# Patient Record
Sex: Male | Born: 1980 | Race: White | Hispanic: No | Marital: Married | Smoking: Never smoker
Health system: Southern US, Community
[De-identification: ages and names within clinical notes are randomized; demographics above are authoritative.]

## PROBLEM LIST (undated history)

## (undated) DIAGNOSIS — T7840XA Allergy, unspecified, initial encounter: Secondary | ICD-10-CM

## (undated) DIAGNOSIS — R0789 Other chest pain: Secondary | ICD-10-CM

## (undated) DIAGNOSIS — F32A Depression, unspecified: Secondary | ICD-10-CM

## (undated) DIAGNOSIS — F329 Major depressive disorder, single episode, unspecified: Secondary | ICD-10-CM

## (undated) DIAGNOSIS — F419 Anxiety disorder, unspecified: Secondary | ICD-10-CM

## (undated) HISTORY — DX: Major depressive disorder, single episode, unspecified: F32.9

## (undated) HISTORY — DX: Allergy, unspecified, initial encounter: T78.40XA

## (undated) HISTORY — DX: Depression, unspecified: F32.A

## (undated) HISTORY — DX: Anxiety disorder, unspecified: F41.9

## (undated) HISTORY — PX: OTHER SURGICAL HISTORY: SHX169

## (undated) HISTORY — DX: Other chest pain: R07.89

---

## 2014-07-25 ENCOUNTER — Encounter (HOSPITAL_COMMUNITY): Payer: Self-pay | Admitting: *Deleted

## 2014-07-25 ENCOUNTER — Emergency Department (HOSPITAL_COMMUNITY)
Admission: EM | Admit: 2014-07-25 | Discharge: 2014-07-25 | Disposition: A | Discharge status: Home / Self Care | Payer: No Typology Code available for payment source | Attending: Emergency Medicine | Admitting: Emergency Medicine

## 2014-07-25 DIAGNOSIS — S3992XA Unspecified injury of lower back, initial encounter: Secondary | ICD-10-CM | POA: Insufficient documentation

## 2014-07-25 DIAGNOSIS — S199XXA Unspecified injury of neck, initial encounter: Secondary | ICD-10-CM | POA: Insufficient documentation

## 2014-07-25 DIAGNOSIS — Y998 Other external cause status: Secondary | ICD-10-CM | POA: Insufficient documentation

## 2014-07-25 DIAGNOSIS — Y92410 Unspecified street and highway as the place of occurrence of the external cause: Secondary | ICD-10-CM | POA: Insufficient documentation

## 2014-07-25 DIAGNOSIS — S79912A Unspecified injury of left hip, initial encounter: Secondary | ICD-10-CM | POA: Diagnosis not present

## 2014-07-25 DIAGNOSIS — Y9389 Activity, other specified: Secondary | ICD-10-CM | POA: Diagnosis not present
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
## 2015-04-18 ENCOUNTER — Encounter: Payer: Self-pay | Admitting: Podiatry

## 2015-04-18 ENCOUNTER — Ambulatory Visit (INDEPENDENT_AMBULATORY_CARE_PROVIDER_SITE_OTHER): Payer: Self-pay | Admitting: Podiatry

## 2015-04-18 ENCOUNTER — Ambulatory Visit (INDEPENDENT_AMBULATORY_CARE_PROVIDER_SITE_OTHER): Payer: Self-pay

## 2015-04-18 VITALS — BP 107/59 | HR 88 | Resp 16 | Ht 72.0 in | Wt 192.0 lb

## 2015-04-18 DIAGNOSIS — M779 Enthesopathy, unspecified: Secondary | ICD-10-CM

## 2015-04-18 DIAGNOSIS — M79673 Pain in unspecified foot: Secondary | ICD-10-CM

## 2015-04-18 NOTE — Progress Notes (Signed)
   Subjective:    Patient ID: Garrett Washington, male    DOB: [birth year], 34 y.o.   MRN: [id number]  HPI Patient presents with foot pain in their left foot-medial side - near great toe; pt stated, "has had this issue all their life".   Review of Systems  Constitutional: Positive for fever.  HENT: Positive for sinus pressure.   Allergic/Immunologic: Positive for environmental allergies.  All other systems reviewed and are negative.      Objective:   Physical Exam        Assessment & Plan:

## 2015-04-18 NOTE — Progress Notes (Signed)
Subjective:     Patient ID: Garrett Washington, male   DOB: 05/15/1980, 34 y.o.   MRN: [id number]  HPI patient states both my feet tend to be flat and I gets some discomfort on the inside of my left arch over right arch   Review of Systems  All other systems reviewed and are negative.      Objective:   Physical Exam  Constitutional: He is oriented to person, place, and time.  Cardiovascular: Intact distal pulses.   Musculoskeletal: Normal range of motion.  Neurological: He is oriented to person, place, and time.  Skin: Skin is warm.  Nursing note and vitals reviewed.  neurovascular status found to be intact muscle strength adequate range of motion within normal limits and patient found to have hypertrophy around the posterior tibial insertion into the navicular left over right with moderate depression of the arch noted     Assessment:      tendinitis secondary to foot structure    Plan:      H&P and x-rays reviewed. Scanned for custom orthotics to reduce plantar pressures and explained usage of orthotics

## 2015-05-09 ENCOUNTER — Ambulatory Visit: Payer: Self-pay | Admitting: *Deleted

## 2015-05-09 DIAGNOSIS — M779 Enthesopathy, unspecified: Secondary | ICD-10-CM

## 2015-05-09 NOTE — Patient Instructions (Signed)

## 2015-05-09 NOTE — Progress Notes (Signed)
Patient ID: Garrett Washington, male   DOB: 21-Nov-1980, 35 y.o.   MRN: [id number] Patient presents for orthotic pick up.  Verbal and written break in and wear instructions given.  Patient will follow up in 4 weeks if symptoms worsen or fail to improve.

## 2015-11-02 DIAGNOSIS — H5213 Myopia, bilateral: Secondary | ICD-10-CM | POA: Diagnosis not present

## 2015-11-06 ENCOUNTER — Ambulatory Visit: Payer: Self-pay | Admitting: Family Medicine

## 2015-11-27 ENCOUNTER — Encounter: Payer: Self-pay | Admitting: Adult Health

## 2015-11-27 ENCOUNTER — Ambulatory Visit (INDEPENDENT_AMBULATORY_CARE_PROVIDER_SITE_OTHER): Payer: 59 | Admitting: Adult Health

## 2015-11-27 VITALS — BP 128/80 | Temp 98.2°F | Ht 72.0 in | Wt 186.0 lb

## 2015-11-27 DIAGNOSIS — F418 Other specified anxiety disorders: Secondary | ICD-10-CM

## 2015-11-27 DIAGNOSIS — L0390 Cellulitis, unspecified: Secondary | ICD-10-CM | POA: Diagnosis not present

## 2015-11-27 DIAGNOSIS — Z0000 Encounter for general adult medical examination without abnormal findings: Secondary | ICD-10-CM | POA: Diagnosis not present

## 2015-11-27 DIAGNOSIS — L0291 Cutaneous abscess, unspecified: Secondary | ICD-10-CM

## 2015-11-27 DIAGNOSIS — R0789 Other chest pain: Secondary | ICD-10-CM

## 2015-11-27 DIAGNOSIS — F419 Anxiety disorder, unspecified: Secondary | ICD-10-CM

## 2015-11-27 DIAGNOSIS — F329 Major depressive disorder, single episode, unspecified: Secondary | ICD-10-CM

## 2015-11-27 LAB — BASIC METABOLIC PANEL
BUN: 13 mg/dL (ref 6–23)
CALCIUM: 9.5 mg/dL (ref 8.4–10.5)
CO2: 30 mEq/L (ref 19–32)
CREATININE: 1.02 mg/dL (ref 0.40–1.50)
Chloride: 103 mEq/L (ref 96–112)
GFR: 88.16 mL/min (ref >60.00)
GLUCOSE: 84 mg/dL (ref 70–99)
Potassium: 3.8 mEq/L (ref 3.5–5.1)
Sodium: 142 mEq/L (ref 135–145)

## 2015-11-27 LAB — CBC WITH DIFFERENTIAL/PLATELET
BASOS ABS: 0 10*3/uL (ref 0.0–0.1)
Basophils Relative: 0.5 % (ref 0.0–3.0)
EOS ABS: 0.1 10*3/uL (ref 0.0–0.7)
Eosinophils Relative: 1.4 % (ref 0.0–5.0)
HCT: 42.5 % (ref 39.0–52.0)
Hemoglobin: 14.7 g/dL (ref 13.0–17.0)
LYMPHS ABS: 1.8 10*3/uL (ref 0.7–4.0)
LYMPHS PCT: 30.6 % (ref 12.0–46.0)
MCHC: 34.7 g/dL (ref 30.0–36.0)
MCV: 83.4 fl (ref 78.0–100.0)
Monocytes Absolute: 0.4 10*3/uL (ref 0.1–1.0)
Monocytes Relative: 6.5 % (ref 3.0–12.0)
NEUTROS ABS: 3.5 10*3/uL (ref 1.4–7.7)
Neutrophils Relative %: 61 % (ref 43.0–77.0)
PLATELETS: 136 10*3/uL — AB (ref 150.0–400.0)
RBC: 5.1 Mil/uL (ref 4.22–5.81)
RDW: 13.5 % (ref 11.5–15.5)
WBC: 5.8 10*3/uL (ref 4.0–10.5)

## 2015-11-27 LAB — LIPID PANEL
CHOLESTEROL: 180 mg/dL (ref 0–200)
HDL: 43.6 mg/dL (ref >39.00)
LDL Cholesterol: 123 mg/dL — ABNORMAL HIGH (ref 0–99)
NonHDL: 136.76
TRIGLYCERIDES: 68 mg/dL (ref 0.0–149.0)
Total CHOL/HDL Ratio: 4
VLDL: 13.6 mg/dL (ref 0.0–40.0)

## 2015-11-27 LAB — HEPATIC FUNCTION PANEL
ALT: 19 U/L (ref 0–53)
AST: 17 U/L (ref 0–37)
Albumin: 4.5 g/dL (ref 3.5–5.2)
Alkaline Phosphatase: 67 U/L (ref 39–117)
Bilirubin, Direct: 0.1 mg/dL (ref 0.0–0.3)
Total Bilirubin: 0.7 mg/dL (ref 0.2–1.2)
Total Protein: 7 g/dL (ref 6.0–8.3)

## 2015-11-27 LAB — POC URINALSYSI DIPSTICK (AUTOMATED)
Bilirubin, UA: NEGATIVE
Blood, UA: NEGATIVE
Glucose, UA: NEGATIVE
KETONES UA: NEGATIVE
Leukocytes, UA: NEGATIVE
Nitrite, UA: NEGATIVE
PROTEIN UA: NEGATIVE
SPEC GRAV UA: 1.02
Urobilinogen, UA: 0.2
pH, UA: 6

## 2015-11-27 LAB — TSH: TSH: 2.2 u[IU]/mL (ref 0.35–4.50)

## 2015-11-27 MED ORDER — DOXYCYCLINE HYCLATE 100 MG PO CAPS: 100.0000 mg | ORAL_CAPSULE | Freq: Every day | ORAL | 1 refills | Status: DC

## 2015-11-27 NOTE — Progress Notes (Signed)
Patient presents to clinic today to establish care. He is a pleasant 35 year male who  has a past medical history of Anxiety; Chest wall pain; and Depression.  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   Acute Concerns: Complete Physical   Chronic Issues: Anxiety/Depression  - Feels as though this is well controlled. He is not on any medication. Occassional he feels anxious but chalks this up to anxiety from his job   Chest wall pain - This is a chronic issue. Feels as though this is from anxiety   Abscess - He has multiple small abscesss' around his belt line. This is a chronic issue. Denies using anything OTC to help.   Health Maintenance: Dental -- Does not see [facility] -- Routine visits.  Immunizations --UTD  Diet: eats healthy Exercise: likes to hike  Past Medical History:  Diagnosis Date  . Anxiety   . Chest wall pain   . Depression     Past Surgical History:  Procedure Laterality Date  . cyst removal       No current outpatient prescriptions on file prior to visit.   No current facility-administered medications on file prior to visit.     No Known Allergies  Family History  Problem Relation Age of Onset  . Hyperlipidemia Mother   . Hypertension Mother   . Arthritis Mother   . Thyroid cancer Mother   . Hodgkin's lymphoma Mother     Social History   Social History  . Marital status: Married    Spouse name: N/A  . Number of children: N/A  . Years of education: N/A   Occupational History  . Respiratory Therapist    Social History Main Topics  . Smoking status: Never Smoker  . Smokeless tobacco: Never Used  . Alcohol use 1.8 oz/week    3 Cans of beer per week     Comment: "Maybe 3-4 beers a week,"  . Drug use: No  . Sexual activity: Not on file   Other Topics Concern  . 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           Review of Systems  Constitutional: Negative.   HENT: Negative.   Eyes: Negative.   Respiratory: Negative.   Cardiovascular: Positive for chest pain (chronic ).  Gastrointestinal: Negative.   Genitourinary: Negative.   Musculoskeletal: Negative.   Skin: Negative.        Abscess around belt line  Neurological: Negative.   Endo/Heme/Allergies: Negative.   Psychiatric/Behavioral: Negative.   All other systems reviewed and are negative.      BP 128/80   Temp 98.2 F (36.8 C) (Oral)   Ht 6' (1.829 m)   Wt 186 lb (84.4 kg)   BMI 25.23 kg/m   Physical Exam  Constitutional: He is oriented to person, place, and time and well-developed, well-nourished, and in no distress. No distress.  HENT:  Head: Normocephalic and atraumatic.  Right Ear: External ear normal.  Left Ear: External ear normal.  Nose: Nose normal.  Mouth/Throat: Oropharynx is clear and moist. No oropharyngeal exudate.  Eyes: Conjunctivae and EOM are normal. Pupils are equal, round, and reactive to light. Right eye exhibits no discharge. Left eye exhibits no discharge. No scleral  icterus.  Neck: Normal range of motion. Neck supple. No JVD present. No tracheal deviation present. No thyromegaly present.  Cardiovascular: Normal rate, regular rhythm, normal heart sounds and intact distal pulses.  Exam reveals no gallop and no friction rub.   No murmur heard. Pulmonary/Chest: Effort normal and breath sounds normal. No stridor. No respiratory distress. He has no wheezes. He has no rales. He exhibits no tenderness.  Abdominal: Soft. Bowel sounds are normal. He exhibits no distension and no mass. There is no tenderness. There is no rebound and no guarding.  Musculoskeletal: He exhibits no edema, tenderness or deformity.  Lymphadenopathy:    He has no cervical adenopathy.  Neurological: He is alert and oriented to person, place, and time. He has normal reflexes. He displays normal reflexes. No cranial  nerve deficit. He exhibits normal muscle tone. Gait normal. Coordination normal. GCS score is 15.  Skin: Skin is warm and dry. No rash noted. He is not diaphoretic. No erythema. No pallor.  Multiple small abscess around left side of waist. No drainage noted. Tenderness with palpation   Psychiatric: Mood, memory, affect and judgment normal.  Nursing note and vitals reviewed.   Assessment/Plan: 1. Routine general medical examination at a health care facility - POCT Urinalysis Dipstick (Automated) - Basic metabolic panel - CBC with Differential/Platelet - Hepatic function panel - Lipid panel - TSH - Follow up in one year or sooner if needed   2. Anxiety and depression - Controlled  3. Chest wall pain - EKG 12-Lead- Sinus  Rhythm  -  Negative precordial T-waves ( possibly artifact) ., Rate 61  4. Cellulitis and abscess - doxycycline (VIBRAMYCIN) 100 MG capsule; Take 1 capsule (100 mg total) by mouth daily.  Dispense: 90 capsule; Refill: 1  Dorothyann Peng, NP

## 2015-11-27 NOTE — Patient Instructions (Addendum)
It was great meeting you today!  I will follow up with you regarding your blood work   I have sent in a prescription for Doxycycline, take one pill daily until the abscess resolves. You can also try applying powder to the area.   Follow up with me in one year or sooner if needed  Health Maintenance, Male A healthy lifestyle and preventative care can promote health and wellness.  Maintain regular health, dental, and eye exams.  Eat a healthy diet. Foods like vegetables, fruits, whole grains, low-fat dairy products, and lean protein foods contain the nutrients you need and are low in calories. Decrease your intake of foods high in solid fats, added sugars, and salt. Get information about a proper diet from your health care provider, if necessary.  Regular physical exercise is one of the most important things you can do for your health. Most adults should get at least 150 minutes of moderate-intensity exercise (any activity that increases your heart rate and causes you to sweat) each week. In addition, most adults need muscle-strengthening exercises on 2 or more days a week.   Maintain a healthy weight. The body mass index (BMI) is a screening tool to identify possible weight problems. It provides an estimate of body fat based on height and weight. Your health care provider can find your BMI and can help you achieve or maintain a healthy weight. For males 20 years and older:  A BMI below 18.5 is considered underweight.  A BMI of 18.5 to 24.9 is normal.  A BMI of 25 to 29.9 is considered overweight.  A BMI of 30 and above is considered obese.  Maintain normal blood lipids and cholesterol by exercising and minimizing your intake of saturated fat. Eat a balanced diet with plenty of fruits and vegetables. Blood tests for lipids and cholesterol should begin at age 60 and be repeated every 5 years. If your lipid or cholesterol levels are high, you are over age 93, or you are at high risk for heart  disease, you may need your cholesterol levels checked more frequently.Ongoing high lipid and cholesterol levels should be treated with medicines if diet and exercise are not working.  If you smoke, find out from your health care provider how to quit. If you do not use tobacco, do not start.  Lung cancer screening is recommended for adults aged 32-80 years who are at high risk for developing lung cancer because of a history of smoking. A yearly low-dose CT scan of the lungs is recommended for people who have at least a 30-pack-year history of smoking and are current smokers or have quit within the past 15 years. A pack year of smoking is smoking an average of 1 pack of cigarettes a day for 1 year (for example, a 30-pack-year history of smoking could mean smoking 1 pack a day for 30 years or 2 packs a day for 15 years). Yearly screening should continue until the smoker has stopped smoking for at least 15 years. Yearly screening should be stopped for people who develop a health problem that would prevent them from having lung cancer treatment.  If you choose to drink alcohol, do not have more than 2 drinks per day. One drink is considered to be 12 oz (360 mL) of beer, 5 oz (150 mL) of wine, or 1.5 oz (45 mL) of liquor.  Avoid the use of street drugs. Do not share needles with anyone. Ask for help if you need support or instructions  about stopping the use of drugs.  High blood pressure causes heart disease and increases the risk of stroke. High blood pressure is more likely to develop in:  People who have blood pressure in the end of the normal range (100-139/85-89 mm Hg).  People who are overweight or obese.  People who are African American.  If you are 79-1 years of age, have your blood pressure checked every 3-5 years. If you are 41 years of age or older, have your blood pressure checked every year. You should have your blood pressure measured twice--once when you are at a hospital or clinic, and  once when you are not at a hospital or clinic. Record the average of the two measurements. To check your blood pressure when you are not at a hospital or clinic, you can use:  An automated blood pressure machine at a pharmacy.  A home blood pressure monitor.  If you are 98-57 years old, ask your health care provider if you should take aspirin to prevent heart disease.  Diabetes screening involves taking a blood sample to check your fasting blood sugar level. This should be done once every 3 years after age 18 if you are at a normal weight and without risk factors for diabetes. Testing should be considered at a younger age or be carried out more frequently if you are overweight and have at least 1 risk factor for diabetes.  Colorectal cancer can be detected and often prevented. Most routine colorectal cancer screening begins at the age of 57 and continues through age 57. However, your health care provider may recommend screening at an earlier age if you have risk factors for colon cancer. On a yearly basis, your health care provider may provide home test kits to check for hidden blood in the stool. A small camera at the end of a tube may be used to directly examine the colon (sigmoidoscopy or colonoscopy) to detect the earliest forms of colorectal cancer. Talk to your health care provider about this at age 32 when routine screening begins. A direct exam of the colon should be repeated every 5-10 years through age 64, unless early forms of precancerous polyps or small growths are found.  People who are at an increased risk for hepatitis B should be screened for this virus. You are considered at high risk for hepatitis B if:  You were born in a country where hepatitis B occurs often. Talk with your health care provider about which countries are considered high risk.  Your parents were born in a high-risk country and you have not received a shot to protect against hepatitis B (hepatitis B  vaccine).  You have HIV or AIDS.  You use needles to inject street drugs.  You live with, or have sex with, someone who has hepatitis B.  You are a man who has sex with other men (MSM).  You get hemodialysis treatment.  You take certain medicines for conditions like cancer, organ transplantation, and autoimmune conditions.  Hepatitis C blood testing is recommended for all people born from [birth year] through 1965 and any individual with known risk factors for hepatitis C.  Healthy men should no longer receive prostate-specific antigen (PSA) blood tests as part of routine cancer screening. Talk to your health care provider about prostate cancer screening.  Testicular cancer screening is not recommended for adolescents or adult males who have no symptoms. Screening includes self-exam, a health care provider exam, and other screening tests. Consult with your health care provider  about any symptoms you have or any concerns you have about testicular cancer.  Practice safe sex. Use condoms and avoid high-risk sexual practices to reduce the spread of sexually transmitted infections (STIs).  You should be screened for STIs, including gonorrhea and chlamydia if:  You are sexually active and are younger than 24 years.  You are older than 24 years, and your health care provider tells you that you are at risk for this type of infection.  Your sexual activity has changed since you were last screened, and you are at an increased risk for chlamydia or gonorrhea. Ask your health care provider if you are at risk.  If you are at risk of being infected with HIV, it is recommended that you take a prescription medicine daily to prevent HIV infection. This is called pre-exposure prophylaxis (PrEP). You are considered at risk if:  You are a man who has sex with other men (MSM).  You are a heterosexual man who is sexually active with multiple partners.  You take drugs by injection.  You are sexually active  with a partner who has HIV.  Talk with your health care provider about whether you are at high risk of being infected with HIV. If you choose to begin PrEP, you should first be tested for HIV. You should then be tested every 3 months for as long as you are taking PrEP.  Use sunscreen. Apply sunscreen liberally and repeatedly throughout the day. You should seek shade when your shadow is shorter than you. Protect yourself by wearing long sleeves, pants, a wide-brimmed hat, and sunglasses year round whenever you are outdoors.  Tell your health care provider of new moles or changes in moles, especially if there is a change in shape or color. Also, tell your health care provider if a mole is larger than the size of a pencil eraser.  A one-time screening for abdominal aortic aneurysm (AAA) and surgical repair of large AAAs by ultrasound is recommended for men aged 23-75 years who are current or former smokers.  Stay current with your vaccines (immunizations).   This information is not intended to replace advice given to you by your health care provider. Make sure you discuss any questions you have with your health care provider.   Document Released: 10/02/2007 Document Revised: 04/26/2014 Document Reviewed: 08/31/2010 Elsevier Interactive Patient Education [employer].

## 2015-12-01 ENCOUNTER — Telehealth: Payer: Self-pay | Admitting: Adult Health

## 2015-12-01 NOTE — Telephone Encounter (Signed)
Garrett Washington pt returning your call

## 2015-12-02 NOTE — Telephone Encounter (Signed)
Patient informed of lab results. 

## 2016-01-19 DIAGNOSIS — L814 Other melanin hyperpigmentation: Secondary | ICD-10-CM | POA: Diagnosis not present

## 2016-01-19 DIAGNOSIS — Z86018 Personal history of other benign neoplasm: Secondary | ICD-10-CM | POA: Diagnosis not present

## 2016-01-19 DIAGNOSIS — D225 Melanocytic nevi of trunk: Secondary | ICD-10-CM | POA: Diagnosis not present

## 2016-01-19 DIAGNOSIS — L821 Other seborrheic keratosis: Secondary | ICD-10-CM | POA: Diagnosis not present

## 2016-01-19 DIAGNOSIS — D1801 Hemangioma of skin and subcutaneous tissue: Secondary | ICD-10-CM | POA: Diagnosis not present

## 2016-09-14 ENCOUNTER — Ambulatory Visit: Payer: 59 | Admitting: Adult Health

## 2016-09-20 ENCOUNTER — Encounter: Payer: Self-pay | Admitting: Family Medicine

## 2016-09-20 ENCOUNTER — Ambulatory Visit (INDEPENDENT_AMBULATORY_CARE_PROVIDER_SITE_OTHER): Payer: Managed Care, Other (non HMO) | Admitting: Family Medicine

## 2016-09-20 VITALS — BP 118/90 | HR 76 | Temp 98.7°F | Wt 185.6 lb

## 2016-09-20 DIAGNOSIS — J324 Chronic pansinusitis: Secondary | ICD-10-CM

## 2016-09-20 DIAGNOSIS — R05 Cough: Secondary | ICD-10-CM

## 2016-09-20 DIAGNOSIS — R059 Cough, unspecified: Secondary | ICD-10-CM

## 2016-09-20 MED ORDER — BENZONATATE 100 MG PO CAPS: 100.0000 mg | ORAL_CAPSULE | Freq: Three times a day (TID) | ORAL | 0 refills | Status: DC

## 2016-09-20 MED ORDER — AMOXICILLIN-POT CLAVULANATE 875-125 MG PO TABS: 1.0000 | ORAL_TABLET | Freq: Two times a day (BID) | ORAL | 0 refills | Status: DC

## 2016-09-20 NOTE — Patient Instructions (Addendum)
Please take medications as directed and follow up if symptoms do not improve with treatment in 3 to 4 days, worsens, or you develop a fever >100.  Also, please schedule a physical with your provider that is due in August.   Sinusitis, Adult Sinusitis is soreness and inflammation of your sinuses. Sinuses are hollow spaces in the bones around your face. They are located:  Around your eyes.  In the middle of your forehead.  Behind your nose.  In your cheekbones.  Your sinuses and nasal passages are lined with a stringy fluid (mucus). Mucus normally drains out of your sinuses. When your nasal tissues get inflamed or swollen, the mucus can get trapped or blocked so air cannot flow through your sinuses. This lets bacteria, viruses, and funguses grow, and that leads to infection. Follow these instructions at home: Medicines  Take, use, or apply over-the-counter and prescription medicines only as told by your doctor. These may include nasal sprays.  If you were prescribed an antibiotic medicine, take it as told by your doctor. Do not stop taking the antibiotic even if you start to feel better. Hydrate and Humidify  Drink enough water to keep your pee (urine) clear or pale yellow.  Use a cool mist humidifier to keep the humidity level in your home above 50%.  Breathe in steam for 10-15 minutes, 3-4 times a [place] or as told by your doctor. You can do this in the bathroom while a hot shower is running.  Try not to spend time in cool or dry air. Rest  Rest as much as possible.  Sleep with your head raised (elevated).  Make sure to get enough sleep each night. General instructions  Put a warm, moist washcloth on your face 3-4 times a Washington or as told by your doctor. This will help with discomfort.  Wash your hands often with soap and water. If there is no soap and water, use hand sanitizer.  Do not smoke. Avoid being around people who are smoking (secondhand smoke).  Keep all follow-up  visits as told by your doctor. This is important. Contact a doctor if:  You have a fever.  Your symptoms get worse.  Your symptoms do not get better within 10 days. Get help right away if:  You have a very bad headache.  You cannot stop throwing up (vomiting).  You have pain or swelling around your face or eyes.  You have trouble seeing.  You feel confused.  Your neck is stiff.  You have trouble breathing. This information is not intended to replace advice given to you by your health care provider. Make sure you discuss any questions you have with your health care provider. Document Released: 09/22/2007 Document Revised: 11/30/2015 Document Reviewed: 01/29/2015 Elsevier Interactive Patient Education  2018 [employer] NOW OFFER   Garrett Washington Garrett TRACK!!!  Garrett Washington Appointments for ACUTE CARE  Such as: Sprains, Injuries, cuts, abrasions, rashes, muscle pain, joint pain, back pain Colds, flu, sore throats, headache, allergies, cough, fever  Ear pain, sinus and eye infections Abdominal pain, nausea, vomiting, diarrhea, upset stomach Animal/insect bites  3 Easy Ways to Schedule: Walk-In Scheduling Call in scheduling Mychart Sign-up: [email or web address]

## 2016-09-20 NOTE — Progress Notes (Signed)
Patient ID: Garrett Washington, male   DOB: 1981-03-01, 36 y.o.   MRN: [id number]  PCP: Dorothyann Peng, NP  Subjective:  Garrett Washington is a 36 y.o. year old very pleasant male patient who presents with  nasal congestion, sinus pressure/pain, post nasal drip, and cough that is nonproductive.   -started: one month ago, symptoms not improving -previous treatments: Mucinex and Zyrtec has provided moderate benefit. -sick contacts/travel/risks: denies flu exposure: Recent exposure with 2 sick work contacts and he works as a Statistician -Hx of: allergies Symptoms started with allergic rhinitis that triggered a URI that improved however cough has lingered with nasal congestion and sinus pressure. ROS-denies fever, SOB, NVD, tooth pain No recent antibiotic use  Pertinent Past Medical History- Seasonal allergies  Medications- reviewed  No current outpatient prescriptions on file.   No current facility-administered medications for this visit.     Objective: BP 118/90 (BP Location: Left Arm, Patient Position: Sitting, Cuff Size: Normal)   Pulse 76   Temp 98.7 F (37.1 C) (Oral)   Wt 185 lb 9.6 oz (84.2 kg)   SpO2 98%   BMI 25.17 kg/m  Gen: NAD, resting comfortably HEENT: Turbinates erythematous, TM normal, pharynx mildly erythematous with no tonsilar exudate or edema, positive frontal and maxillary sinus tenderness CV: RRR no murmurs rubs or gallops Lungs: CTAB no crackles, wheeze, rhonchi Abdomen: soft/nontender/nondistended/normal bowel sounds. No rebound or guarding.  Ext: no edema Skin: warm, dry, no rash Neuro: grossly normal, moves all extremities  Assessment/Plan: 1. Pansinusitis, unspecified chronicity Symptom duration is likely due to sinusitis. Treat with Augmentin and advised patient on supportive measures:  Get rest, drink plenty of fluids, and use tylenol or ibuprofen as needed for pain. Follow up if fever >101, if symptoms worsen or if symptoms are not improved in 3 to  4 days. Patient verbalizes understanding.    2. Cough Mucinex or benzonatate for cough as needed.   Finally, we reviewed reasons to return to care including if symptoms worsen or persist or new concerns arise- once again particularly shortness of breath or fever.  Advised patient that he will be due for a physical in August.  Laurita Quint, [place]

## 2016-09-21 ENCOUNTER — Ambulatory Visit: Payer: 59 | Admitting: Adult Health

## 2016-11-03 ENCOUNTER — Encounter: Payer: Managed Care, Other (non HMO) | Admitting: Adult Health

## 2016-11-03 NOTE — Progress Notes (Deleted)
   Subjective:    Patient ID: Garrett Washington, male    DOB: 1980/04/20, 36 y.o.   MRN: [id number]  HPI  Patient presents for yearly physical  examination. He is a pleasant 36 year old male who  has a past medical history of Allergy; Anxiety; Chest wall pain; and Depression.  All immunizations and health maintenance protocols were reviewed with the patient and needed orders were placed.  Appropriate screening laboratory values were ordered for the patient including screening of hyperlipidemia, renal function and hepatic function. If indicated by BPH, a PSA was ordered.  Medication reconciliation,  past medical history, social history, problem list and allergies were reviewed in detail with the patient  Goals were established with regard to weight loss, exercise, and  diet in compliance with medications      Review of Systems     Objective:   Physical Exam        Assessment & Plan:

## 2016-12-21 ENCOUNTER — Encounter: Payer: Self-pay | Admitting: Family Medicine

## 2016-12-21 ENCOUNTER — Ambulatory Visit (INDEPENDENT_AMBULATORY_CARE_PROVIDER_SITE_OTHER): Payer: Managed Care, Other (non HMO) | Admitting: Family Medicine

## 2016-12-21 VITALS — BP 110/80 | HR 73 | Temp 98.3°F | Wt 194.4 lb

## 2016-12-21 DIAGNOSIS — K219 Gastro-esophageal reflux disease without esophagitis: Secondary | ICD-10-CM

## 2016-12-21 DIAGNOSIS — R0981 Nasal congestion: Secondary | ICD-10-CM

## 2016-12-21 NOTE — Progress Notes (Signed)
Subjective:     Patient ID: Garrett Washington, male   DOB: 03-May-1980, 36 y.o.   MRN: [id number]  HPI Patient seen with several week history of some pansinusitis type pressure but symptoms somewhat intermittent. Denies any cough.. Also complains of fairly frequent GERD symptoms. These occur frequently at night. Sometimes wakes up with sour taste in mouth.  Patient had diagnosis of "pansinusitis "back in early June and did feel better somewhat after Augmentin. He states these symptoms are somewhat different. No headaches. No fever. No chills. He drinks about 2 cups of coffee per day. Has used Flonase but only sporadically.  Denies any exertional chest pains. No exertional dyspnea.  Past Medical History:  Diagnosis Date  . Allergy   . Anxiety   . Chest wall pain   . Depression    Past Surgical History:  Procedure Laterality Date  . cyst removal       reports that he has never smoked. He has never used smokeless tobacco. He reports that he drinks about 1.8 oz of alcohol per week . He reports that he does not use drugs. family history includes Arthritis in his mother; Hodgkin's lymphoma in his mother; Hyperlipidemia in his mother; Hypertension in his mother; Thyroid cancer in his mother. No Known Allergies\   Review of Systems  Constitutional: Negative for chills and fever.  HENT: Positive for congestion and sinus pressure.   Respiratory: Negative for cough, shortness of breath and wheezing.   Cardiovascular: Negative for chest pain.       Objective:   Physical Exam  Constitutional: He appears well-developed and well-nourished.  HENT:  Right Ear: External ear normal.  Left Ear: External ear normal.  Mouth/Throat: Oropharynx is clear and moist.  Neck: Neck supple.  Cardiovascular: Normal rate and regular rhythm.   Pulmonary/Chest: Effort normal and breath sounds normal. No respiratory distress. He has no wheezes. He has no rales.  Lymphadenopathy:    He has no cervical adenopathy.        Assessment:     Patient presents with several week history of sinus congestion along with probable GERD symptoms especially at night. We explained that his GERD could be triggering some chronic sinus congestion    Plan:     -Dietary factors discussed. Try to gradually reduce caffeine intake -avoid eating within 2-3 hours of bedtime. -Get back on Flonase daily -Recommend trial of over-the-counter Nexium or Prilosec -Follow-up for any fever or other change of symptoms -follow up with primary if congestion not improving with Flonase and better GERD control.  Eulas Post MD [facility]

## 2016-12-21 NOTE — Patient Instructions (Signed)
Food Choices for Gastroesophageal Reflux Disease, Adult When you have gastroesophageal reflux disease (GERD), the foods you eat and your eating habits are very important. Choosing the right foods can help ease the discomfort of GERD. Consider working with a diet and nutrition specialist (dietitian) to help you make healthy food choices. What general guidelines should I follow? Eating plan  Choose healthy foods low in fat, such as fruits, vegetables, whole grains, low-fat dairy products, and lean meat, fish, and poultry.  Eat frequent, small meals instead of three large meals each day. Eat your meals slowly, in a relaxed setting. Avoid bending over or lying down until 2-3 hours after eating.  Limit high-fat foods such as fatty meats or fried foods.  Limit your intake of oils, butter, and shortening to less than 8 teaspoons each day.  Avoid the following: ? Foods that cause symptoms. These may be different for different people. Keep a food diary to keep track of foods that cause symptoms. ? Alcohol. ? Drinking large amounts of liquid with meals. ? Eating meals during the 2-3 hours before bed.  Cook foods using methods other than frying. This may include baking, grilling, or broiling. Lifestyle   Maintain a healthy weight. Ask your health care provider what weight is healthy for you. If you need to lose weight, work with your health care provider to do so safely.  Exercise for at least 30 minutes on 5 or more days each week, or as told by your health care provider.  Avoid wearing clothes that fit tightly around your waist and chest.  Do not use any products that contain nicotine or tobacco, such as cigarettes and e-cigarettes. If you need help quitting, ask your health care provider.  Sleep with the head of your bed raised. Use a wedge under the mattress or blocks under the bed frame to raise the head of the bed. What foods are not recommended? The items listed may not be a complete  list. Talk with your dietitian about what dietary choices are best for you. Grains Pastries or quick breads with added fat. French toast. Vegetables Deep fried vegetables. French fries. Any vegetables prepared with added fat. Any vegetables that cause symptoms. For some people this may include tomatoes and tomato products, chili peppers, onions and garlic, and horseradish. Fruits Any fruits prepared with added fat. Any fruits that cause symptoms. For some people this may include citrus fruits, such as oranges, grapefruit, pineapple, and lemons. Meats and other protein foods High-fat meats, such as fatty beef or pork, hot dogs, ribs, ham, sausage, salami and bacon. Fried meat or protein, including fried fish and fried chicken. Nuts and nut butters. Dairy Whole milk and chocolate milk. Sour cream. Cream. Ice cream. Cream cheese. Milk shakes. Beverages Coffee and tea, with or without caffeine. Carbonated beverages. Sodas. Energy drinks. Fruit juice made with acidic fruits (such as orange or grapefruit). Tomato juice. Alcoholic drinks. Fats and oils Butter. Margarine. Shortening. Ghee. Sweets and desserts Chocolate and cocoa. Donuts. Seasoning and other foods Pepper. Peppermint and spearmint. Any condiments, herbs, or seasonings that cause symptoms. For some people, this may include curry, hot sauce, or vinegar-based salad dressings. Summary  When you have gastroesophageal reflux disease (GERD), food and lifestyle choices are very important to help ease the discomfort of GERD.  Eat frequent, small meals instead of three large meals each day. Eat your meals slowly, in a relaxed setting. Avoid bending over or lying down until 2-3 hours after eating.  Limit high-fat   foods such as fatty meat or fried foods. This information is not intended to replace advice given to you by your health care provider. Make sure you discuss any questions you have with your health care provider. Document Released:  04/05/2005 Document Revised: 04/06/2016 Document Reviewed: 04/06/2016 Elsevier Interactive Patient Education  2017 [place] back on daily Flonase Consider trial of OTC Nexium or Prilosec.   Try to gradually reduce caffeine.

## 2017-01-07 ENCOUNTER — Encounter: Payer: Self-pay | Admitting: Adult Health

## 2017-01-20 ENCOUNTER — Encounter: Payer: Self-pay | Admitting: Adult Health

## 2017-01-20 ENCOUNTER — Ambulatory Visit (INDEPENDENT_AMBULATORY_CARE_PROVIDER_SITE_OTHER): Payer: Managed Care, Other (non HMO) | Admitting: Adult Health

## 2017-01-20 VITALS — BP 110/60 | Temp 97.9°F | Ht 73.0 in | Wt 195.0 lb

## 2017-01-20 DIAGNOSIS — F419 Anxiety disorder, unspecified: Secondary | ICD-10-CM

## 2017-01-20 DIAGNOSIS — F329 Major depressive disorder, single episode, unspecified: Secondary | ICD-10-CM | POA: Diagnosis not present

## 2017-01-20 DIAGNOSIS — Z0000 Encounter for general adult medical examination without abnormal findings: Secondary | ICD-10-CM

## 2017-01-20 DIAGNOSIS — F32A Depression, unspecified: Secondary | ICD-10-CM

## 2017-01-20 LAB — CBC WITH DIFFERENTIAL/PLATELET
Basophils Absolute: 0 10*3/uL (ref 0.0–0.1)
Basophils Relative: 0.7 % (ref 0.0–3.0)
Eosinophils Absolute: 0.1 10*3/uL (ref 0.0–0.7)
Eosinophils Relative: 2.3 % (ref 0.0–5.0)
HCT: 43.7 % (ref 39.0–52.0)
Hemoglobin: 14.8 g/dL (ref 13.0–17.0)
LYMPHS ABS: 1.4 10*3/uL (ref 0.7–4.0)
Lymphocytes Relative: 28.1 % (ref 12.0–46.0)
MCHC: 33.8 g/dL (ref 30.0–36.0)
MCV: 84.5 fl (ref 78.0–100.0)
MONO ABS: 0.4 10*3/uL (ref 0.1–1.0)
MONOS PCT: 8 % (ref 3.0–12.0)
NEUTROS PCT: 60.9 % (ref 43.0–77.0)
Neutro Abs: 3.1 10*3/uL (ref 1.4–7.7)
Platelets: 125 10*3/uL — ABNORMAL LOW (ref 150.0–400.0)
RBC: 5.17 Mil/uL (ref 4.22–5.81)
RDW: 14.5 % (ref 11.5–15.5)
WBC: 5.2 10*3/uL (ref 4.0–10.5)

## 2017-01-20 LAB — COMPREHENSIVE METABOLIC PANEL
ALBUMIN: 4.4 g/dL (ref 3.5–5.2)
ALT: 21 U/L (ref 0–53)
AST: 18 U/L (ref 0–37)
Alkaline Phosphatase: 73 U/L (ref 39–117)
BUN: 11 mg/dL (ref 6–23)
CO2: 29 mEq/L (ref 19–32)
Calcium: 9.6 mg/dL (ref 8.4–10.5)
Chloride: 102 mEq/L (ref 96–112)
Creatinine, Ser: 0.94 mg/dL (ref 0.40–1.50)
GFR: 96.25 mL/min (ref >60.00)
Glucose, Bld: 82 mg/dL (ref 70–99)
Potassium: 3.7 mEq/L (ref 3.5–5.1)
Sodium: 140 mEq/L (ref 135–145)
Total Bilirubin: 0.7 mg/dL (ref 0.2–1.2)
Total Protein: 7 g/dL (ref 6.0–8.3)

## 2017-01-20 LAB — LIPID PANEL
Cholesterol: 181 mg/dL (ref 0–200)
HDL: 44.6 mg/dL (ref >39.00)
LDL Cholesterol: 117 mg/dL — ABNORMAL HIGH (ref 0–99)
NonHDL: 136.14
Total CHOL/HDL Ratio: 4
Triglycerides: 96 mg/dL (ref 0.0–149.0)
VLDL: 19.2 mg/dL (ref 0.0–40.0)

## 2017-01-20 MED ORDER — MAGIC MOUTHWASH W/LIDOCAINE: 5.0000 mL | Freq: Three times a day (TID) | ORAL | 1 refills | Status: DC | PRN

## 2017-01-20 NOTE — Progress Notes (Signed)
Subjective:    Patient ID: Garrett Washington, male    DOB: 13-Aug-1980, 36 y.o.   MRN: [id number]  HPI  Patient presents for yearly preventative medicine examination. He is a pleasant 36 year old male who  has a past medical history of Allergy; Anxiety; Chest wall pain; and Depression.   Anxiety/Depression  - Feels as though this is well controlled without medication - stable   Chest wall pain - This is a chronic issue. Feels as though this is from anxiety - stable   GERD - was stared on Prilosec by Dr. Elease Hashimoto, one month ago. Reports significant improvement since starting this medication   All immunizations and health maintenance protocols were reviewed with the patient and needed orders were placed.    Appropriate screening laboratory values were ordered for the patient including screening of hyperlipidemia, renal function and hepatic function. Medication reconciliation,  past medical history, social history, problem list and allergies were reviewed in detail with the patient  Goals were established with regard to weight loss, exercise, and  diet in compliance with medications. He eats a heart healthy diet and is an active individual  He reports that since the last time we saw each other, he has a new baby and he has decided to go to [employer] school, so he is working on the pre reqs   Review of Systems  Constitutional: Negative.   HENT: Negative.   Eyes: Negative.   Respiratory: Negative.   Cardiovascular: Negative.   Gastrointestinal: Negative.   Endocrine: Negative.   Genitourinary: Negative.   Musculoskeletal: Negative.   Skin: Negative.   Allergic/Immunologic: Negative.   Neurological: Negative.   Hematological: Negative.   Psychiatric/Behavioral: Negative.   All other systems reviewed and are negative.  Past Medical History:  Diagnosis Date  . Allergy   . Anxiety   . Chest wall pain   . Depression     Social History   Social History  . Marital status: Married    Spouse name: N/A  . Number of children: N/A  . Years of education: N/A   Occupational History  . Respiratory Therapist    Social History Main Topics  . Smoking status: Never Smoker  . Smokeless tobacco: Never Used  . Alcohol use 1.8 oz/week    3 Cans of beer per week     Comment: "Maybe 3-4 beers a week,"  . Drug use: No  . Sexual activity: Not on file   Other Topics Concern  . 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           Past Surgical History:  Procedure Laterality Date  . cyst removal       Family History  Problem Relation Age of Onset  . Hyperlipidemia Mother   . Hypertension Mother   . Arthritis Mother   . Thyroid cancer Mother   . Hodgkin's lymphoma Mother     No Known Allergies  No current outpatient prescriptions on file prior to visit.   No current facility-administered medications on file prior to visit.     Temp 97.9 F (36.6 C) (Oral)   Ht 6\' 1"  (1.854 m)   Wt 195 lb (88.5 kg)   BMI 25.73 kg/m       Objective:   Physical Exam  Constitutional: He is  oriented to person, place, and time. He appears well-developed and well-nourished. No distress.  HENT:  Head: Normocephalic and atraumatic.  Right Ear: External ear normal.  Left Ear: External ear normal.  Nose: Nose normal.  Mouth/Throat: Oropharynx is clear and moist. No oropharyngeal exudate.  Eyes: Pupils are equal, round, and reactive to light. Conjunctivae and EOM are normal. Right eye exhibits no discharge. Left eye exhibits no discharge. No scleral icterus.  Neck: Normal range of motion. Neck supple. No JVD present. No tracheal deviation present. No thyromegaly present.  Cardiovascular: Normal rate, regular rhythm, normal heart sounds and intact distal pulses.  Exam reveals no gallop and no friction rub.   No murmur heard. Pulmonary/Chest: Effort normal and breath sounds  normal. No stridor. No respiratory distress. He has no wheezes. He has no rales. He exhibits no tenderness.  Abdominal: Soft. Bowel sounds are normal. He exhibits no distension and no mass. There is no tenderness. There is no rebound and no guarding.  Musculoskeletal: Normal range of motion.  Lymphadenopathy:    He has no cervical adenopathy.  Neurological: He is alert and oriented to person, place, and time. He has normal reflexes. He displays normal reflexes. No cranial nerve deficit. He exhibits normal muscle tone. Coordination normal.  Skin: Skin is warm and dry. No rash noted. He is not diaphoretic. No erythema. No pallor.  Psychiatric: He has a normal mood and affect. His behavior is normal. Judgment and thought content normal.  Nursing note and vitals reviewed.     Assessment & Plan:  1. Routine general medical examination at a health care facility - Benign exam  - Continue to stay active and eat healthy  - Follow up in one year or sooner if needed - Lipid panel - CMP - CBC with Differential/Platelet  2. Anxiety and depression - Controlled without medication

## 2017-01-20 NOTE — Patient Instructions (Signed)
It was great seeing you today!  I will follow up with you regarding your labs   Please let me know if you need anything

## 2017-02-14 ENCOUNTER — Telehealth: Payer: No Typology Code available for payment source | Admitting: Family

## 2017-02-14 ENCOUNTER — Ambulatory Visit: Payer: Managed Care, Other (non HMO) | Admitting: Family Medicine

## 2017-02-14 ENCOUNTER — Ambulatory Visit (INDEPENDENT_AMBULATORY_CARE_PROVIDER_SITE_OTHER): Payer: Managed Care, Other (non HMO) | Admitting: Family Medicine

## 2017-02-14 ENCOUNTER — Encounter: Payer: Self-pay | Admitting: Family Medicine

## 2017-02-14 VITALS — BP 128/70 | HR 78 | Temp 98.9°F | Wt 197.0 lb

## 2017-02-14 DIAGNOSIS — A6920 Lyme disease, unspecified: Secondary | ICD-10-CM | POA: Diagnosis not present

## 2017-02-14 DIAGNOSIS — R5383 Other fatigue: Principal | ICD-10-CM

## 2017-02-14 DIAGNOSIS — R5381 Other malaise: Secondary | ICD-10-CM

## 2017-02-14 MED ORDER — DOXYCYCLINE HYCLATE 100 MG PO TABS: 100.0000 mg | ORAL_TABLET | Freq: Two times a day (BID) | ORAL | 1 refills | Status: DC

## 2017-02-14 NOTE — Progress Notes (Signed)
Garrett Washington is a 36 year old married male nonsmoker who comes in today accompanied by his wife for evaluation of a febrile illness for 8 days  October 21 he was out working in the yard felt well T minutes felt flushed and felt extremely hot. He did not take his temperature but he felt like he had a fever. He took some Tylenol went down. Since that time he's had intermittent spells throughout the day of fever and chills all of which resolved with some Tylenol but the won't go away. He's had a mild headache a mild cough and mild muscle aches. No earache sore throat nausea vomiting diarrhea or urinary tract symptoms.  No foreign travel  Does not recall being around anybody with a febrile illness like this. 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  His wife is an Nurse, children's.  They have a 66-year-old who is well.  Does not recall any tick bites.  BP 128/70 (BP Location: Left Arm, Patient Position: Sitting, Cuff Size: Normal)   Pulse 78   Temp 98.9 F (37.2 C) (Oral)   Wt 197 lb (89.4 kg)   SpO2 98%   BMI 25.99 kg/m  General he is well-developed well-nourished male no acute distress examination HEENT were negative neck was supple no adenopathy cardiopulmonary exam normal abdominal exam normal skin exam normal  #1 febrile illness......... Possibly Lyme's disease.......[facility] spotted fever and a viral type illness........Marland Kitchen Doxycycline 100 twice a day for 10 days

## 2017-02-14 NOTE — Progress Notes (Signed)
Thank you for the details you included in the comment boxes. Those details are very helpful in determining the best course of treatment for you and help [place] to provide the best care. I don't mean to alarm you, but your symptoms are classic in presentation to many types of tick bites. Given the long duration and joint pain, you need to be seen face-to-face for a full workup. If I am correct, and this is treated wrong, it could result in numerous long-term (years) problems for you. Please be seen face-to-face so we can do lab work.   Based on what you shared with me it looks like you have a serious condition that should be evaluated in a face to face office visit.  NOTE: Even if you have entered your credit card information for this eVisit, you will not be charged.   If you are having a true medical emergency please call 911.  If you need an urgent face to face visit, [place] has four urgent care centers for your convenience.  If you need care fast and have a high deductible or no insurance consider:   [email or web address]  [phone]  [address], [address] [place], [place] [address] 8 am to 8 pm Monday-Friday 10 am to 4 pm Saturday-Sunday   The following sites will take your  insurance:    . [facility] [place] a Provider at this Location  [address] [place], [place] [address] . 10 am to 8 pm Monday-Friday . 12 pm to 8 pm Saturday-Sunday   . [facility] [place] a Provider at this Location  [address], [place], [place] [address] . 8 am to 8 pm Monday-Friday . 9 am to 6 pm Saturday . 11 am to 6 pm Sunday   . [facility] [place] Get Driving Directions  [address] [place], [place] [address] . 8 am to 8 pm Monday-Friday . 8 am to 4 pm  Saturday-Sunday   Your e-visit answers were reviewed by a board certified advanced clinical practitioner to complete your personal care plan.  Thank you for using e-Visits.

## 2017-02-14 NOTE — Patient Instructions (Addendum)
Doxycycline 100 mg.......... 2 now........ Then one twice daily till bottle empty  Return on Thursday for follow-up  Tylenol............ 2 tabs every 6-8 hours when necessary for fever and chills  Drink lots of liquids  Call if any problems

## 2017-02-17 ENCOUNTER — Ambulatory Visit (INDEPENDENT_AMBULATORY_CARE_PROVIDER_SITE_OTHER): Payer: Managed Care, Other (non HMO) | Admitting: Adult Health

## 2017-02-17 ENCOUNTER — Encounter: Payer: Self-pay | Admitting: Adult Health

## 2017-02-17 VITALS — BP 100/60 | Temp 98.9°F | Wt 198.0 lb

## 2017-02-17 DIAGNOSIS — R509 Fever, unspecified: Secondary | ICD-10-CM | POA: Diagnosis not present

## 2017-02-17 LAB — CBC WITH DIFFERENTIAL/PLATELET
Basophils Absolute: 0.2 10*3/uL — ABNORMAL HIGH (ref 0.0–0.1)
Basophils Relative: 3 % (ref 0.0–3.0)
Eosinophils Absolute: 0.1 10*3/uL (ref 0.0–0.7)
Eosinophils Relative: 1.3 % (ref 0.0–5.0)
HCT: 37.9 % — ABNORMAL LOW (ref 39.0–52.0)
Hemoglobin: 12.8 g/dL — ABNORMAL LOW (ref 13.0–17.0)
LYMPHS ABS: 2.7 10*3/uL (ref 0.7–4.0)
Lymphocytes Relative: 50.8 % — ABNORMAL HIGH (ref 12.0–46.0)
MCHC: 33.7 g/dL (ref 30.0–36.0)
MCV: 83.6 fl (ref 78.0–100.0)
Monocytes Absolute: 0.6 10*3/uL (ref 0.1–1.0)
Monocytes Relative: 10.5 % (ref 3.0–12.0)
NEUTROS ABS: 1.8 10*3/uL (ref 1.4–7.7)
NEUTROS PCT: 34.4 % — AB (ref 43.0–77.0)
Platelets: 137 10*3/uL — ABNORMAL LOW (ref 150.0–400.0)
RBC: 4.53 Mil/uL (ref 4.22–5.81)
RDW: 14.6 % (ref 11.5–15.5)
WBC: 5.3 10*3/uL (ref 4.0–10.5)

## 2017-02-17 LAB — HEPATIC FUNCTION PANEL
ALBUMIN: 3.6 g/dL (ref 3.5–5.2)
ALT: 111 U/L — ABNORMAL HIGH (ref 0–53)
AST: 69 U/L — ABNORMAL HIGH (ref 0–37)
Alkaline Phosphatase: 67 U/L (ref 39–117)
Bilirubin, Direct: 0.1 mg/dL (ref 0.0–0.3)
Total Bilirubin: 0.6 mg/dL (ref 0.2–1.2)
Total Protein: 6.2 g/dL (ref 6.0–8.3)

## 2017-02-17 NOTE — Progress Notes (Signed)
Subjective:    Patient ID: Garrett Washington, male    DOB: [birth year], 36 y.o.   MRN: [id number]  HPI  36 year old male who  has a past medical history of Allergy; Anxiety; Chest wall pain; and Depression. He presents to the office today for three day follow up. He was seen by Dr. Sherren Mocha for evaluation of Febrile illness for 8 days.  Per note:  October 21 he was out working in the yard felt well T minutes felt flushed and felt extremely hot. He did not take his temperature but he felt like he had a fever. He took some Tylenol went down. Since that time he's had intermittent spells throughout the day of fever and chills all of which resolved with some Tylenol but the won't go away. He's had a mild headache a mild cough and mild muscle aches. No earache sore throat nausea vomiting diarrhea or urinary tract symptoms.  No foreign travel  He was started on Doxycycline for possible lyme disease or RMSF. Labs were not ordered   Today in the office he reports that he is feeling slightly better. He reports that his joint pain has resolved for the most part. He continues to feel fatigued all the time, is having low grade fever with night sweats, and is not sleeping well. He has been taking the Doxycycline as directed as well at [facility] to help control his fever.   He also complains of the feeling of a sinus infection. He has had discolored nasal discharge.   Review of Systems See HPI   Past Medical History:  Diagnosis Date  . Allergy   . Anxiety   . Chest wall pain   . Depression     Social History   Social History  . Marital status: Married    Spouse name: N/A  . Number of children: N/A  . Years of education: N/A   Occupational History  . Respiratory Therapist    Social History Main Topics  . Smoking status: Never Smoker  . Smokeless tobacco: Never Used  . Alcohol use 1.8 oz/week    3 Cans of beer per week     Comment: "Maybe 3-4 beers a week,"  . Drug use: No  . Sexual activity:  Not on file   Other Topics Concern  . 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           Past Surgical History:  Procedure Laterality Date  . cyst removal       Family History  Problem Relation Age of Onset  . Hyperlipidemia Mother   . Hypertension Mother   . Arthritis Mother   . Thyroid cancer Mother   . Hodgkin's lymphoma Mother     No Known Allergies  Current Outpatient Prescriptions on File Prior to Visit  Medication Sig Dispense Refill  . doxycycline (VIBRA-TABS) 100 MG tablet Take 1 tablet (100 mg total) by mouth 2 (two) times daily. 20 tablet 1  . omeprazole (PRILOSEC OTC) 20 MG tablet Take 20 mg by mouth daily.     No current facility-administered medications on file prior to visit.     BP 100/60 (BP Location: Right Arm)   Temp 98.9 F (37.2 C) (Oral)   Wt 198 lb (89.8 kg)   BMI 26.12 kg/m  Objective:   Physical Exam  Constitutional: Vital signs are normal. He appears well-developed and well-nourished. He appears ill. No distress.  HENT:  Head: Normocephalic and atraumatic.  Right Ear: Hearing, tympanic membrane, external ear and ear canal normal.  Left Ear: Tympanic membrane, external ear and ear canal normal.  Nose: Mucosal edema (left) present. Right sinus exhibits no maxillary sinus tenderness and no frontal sinus tenderness. Left sinus exhibits no maxillary sinus tenderness and no frontal sinus tenderness.  Mouth/Throat: Uvula is midline, oropharynx is clear and moist and mucous membranes are normal.  Eyes: Pupils are equal, round, and reactive to light. Conjunctivae and EOM are normal. Right eye exhibits no discharge.  Neck: Normal range of motion. Neck supple. No JVD present. No tracheal deviation present. No thyroid mass and no thyromegaly present.  Pulmonary/Chest: No stridor.  Abdominal: Normal appearance. He exhibits no shifting  dullness, no distension and no mass. There is no hepatosplenomegaly, splenomegaly or hepatomegaly. There is no CVA tenderness.  Lymphadenopathy:    He has no cervical adenopathy.  Skin: He is not diaphoretic.      Assessment & Plan:  1. Fever of unknown origin - Unknown cause at this point. Do not think mono. Possible tick exposure but unlikely. He is improving. 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 Likely cause is viral illness?  - CBC with Differential/Platelet - B. burgdorfi Antibody - Hepatic function panel - DG Chest 2 View; Future   Dorothyann Peng, NP

## 2017-02-18 ENCOUNTER — Ambulatory Visit (INDEPENDENT_AMBULATORY_CARE_PROVIDER_SITE_OTHER)
Admission: RE | Admit: 2017-02-18 | Discharge: 2017-02-18 | Disposition: A | Discharge status: Home / Self Care | Payer: Managed Care, Other (non HMO) | Source: Ambulatory Visit | Attending: Adult Health | Admitting: Adult Health

## 2017-02-18 ENCOUNTER — Other Ambulatory Visit: Payer: Self-pay | Admitting: Adult Health

## 2017-02-18 DIAGNOSIS — R509 Fever, unspecified: Secondary | ICD-10-CM | POA: Diagnosis not present

## 2017-02-19 LAB — HEPATITIS PANEL, ACUTE
HEP B C IGM: NONREACTIVE
HEP C AB: NONREACTIVE
Hep A IgM: NONREACTIVE
Hepatitis B Surface Ag: NONREACTIVE
SIGNAL TO CUT-OFF: 0.01 (ref <1.00)

## 2017-02-19 LAB — TEST AUTHORIZATION

## 2017-02-19 LAB — B. BURGDORFI ANTIBODIES: B burgdorferi Ab IgG+IgM: <0.9 index

## 2017-02-19 LAB — ROCKY MTN SPOTTED FVR AB, IGG-BLOOD: RMSF IgG: NEGATIVE

## 2017-02-28 ENCOUNTER — Encounter: Payer: Self-pay | Admitting: Family Medicine

## 2017-02-28 ENCOUNTER — Ambulatory Visit (INDEPENDENT_AMBULATORY_CARE_PROVIDER_SITE_OTHER): Payer: Managed Care, Other (non HMO) | Admitting: Family Medicine

## 2017-02-28 VITALS — BP 110/70 | HR 90 | Temp 98.1°F | Wt 193.8 lb

## 2017-02-28 DIAGNOSIS — R61 Generalized hyperhidrosis: Secondary | ICD-10-CM | POA: Diagnosis not present

## 2017-02-28 DIAGNOSIS — R7401 Elevation of levels of liver transaminase levels: Secondary | ICD-10-CM

## 2017-02-28 DIAGNOSIS — R740 Nonspecific elevation of levels of transaminase and lactic acid dehydrogenase [LDH]: Secondary | ICD-10-CM

## 2017-02-28 DIAGNOSIS — R509 Fever, unspecified: Secondary | ICD-10-CM

## 2017-02-28 LAB — HEPATIC FUNCTION PANEL
ALT: 324 U/L — ABNORMAL HIGH (ref 0–53)
AST: 144 U/L — AB (ref 0–37)
Albumin: 3.9 g/dL (ref 3.5–5.2)
Alkaline Phosphatase: 80 U/L (ref 39–117)
Bilirubin, Direct: 0.1 mg/dL (ref 0.0–0.3)
Total Bilirubin: 0.7 mg/dL (ref 0.2–1.2)
Total Protein: 6.7 g/dL (ref 6.0–8.3)

## 2017-02-28 NOTE — Progress Notes (Signed)
Subjective:     Patient ID: Garrett Washington, male   DOB: [birth year], 36 y.o.   MRN: [id number]  HPI Patient seen for follow-up regarding recent night sweats and prolonged fever.Marland Kitchen He was seen here initially and there was some concern of possible Lyme disease although no history of tick bite or recent travel. History of 10 days of doxycycline. He came back with persistent fevers and night sweats had several labs and chest x-ray. Did have some cough which was mostly nonproductive. He had mild elevated liver transaminases. Chest x-ray was normal. Lyme IgG antibody was negative. [place] spotted fever antibodies negative. CBC normal. Patient denies any risk factors for HIV. He is monogamous. He does work as Statistician. He states tuberculosis screening last April was negative. His cough is actually improving. He had acute hepatitis panel which was negative  He's had no fever past several days and has not noted any lymphadenopathy. He was concerned because of family history of non-Hodgkin's lymphoma. Patient has not noticed any nodes. His appetite and weight are stable. No skin rashes. No sore throat. No nausea, vomiting, or diarrhea. No dyspnea. No chest pains. No abdominal pain.  Past Medical History:  Diagnosis Date  . Allergy   . Anxiety   . Chest wall pain   . Depression    Past Surgical History:  Procedure Laterality Date  . cyst removal       reports that  has never smoked. he has never used smokeless tobacco. He reports that he drinks about 1.8 oz of alcohol per week. He reports that he does not use drugs. family history includes Arthritis in his mother; Hodgkin's lymphoma in his mother; Hyperlipidemia in his mother; Hypertension in his mother; Thyroid cancer in his mother. No Known Allergies   Review of Systems  Constitutional: Negative for appetite change, chills and unexpected weight change.  Respiratory: Negative for shortness of breath.   Cardiovascular: Negative for chest  pain.  Gastrointestinal: Negative for abdominal pain, diarrhea, nausea and vomiting.  Genitourinary: Negative for dysuria.  Skin: Negative for rash.  Hematological: Negative for adenopathy. Does not bruise/bleed easily.       Objective:   Physical Exam  Constitutional: He appears well-developed and well-nourished.  HENT:  Right Ear: External ear normal.  Left Ear: External ear normal.  Mouth/Throat: Oropharynx is clear and moist.  Neck: Neck supple.  Cardiovascular: Normal rate and regular rhythm. Exam reveals no gallop.  No murmur heard. Pulmonary/Chest: Effort normal and breath sounds normal. No respiratory distress. He has no wheezes. He has no rales.  Abdominal: Soft. He exhibits no mass. There is no tenderness. There is no rebound and no guarding.  Musculoskeletal: He exhibits no edema.  Lymphadenopathy:    He has no cervical adenopathy.  Skin: No rash noted.       Assessment:     Patient presented recently with night sweats and cough and fever about 2 weeks duration. Seems to be improving with resolution of fever and cough (and arthralgias) but still has occasional night sweats. He did have mild elevated liver transaminases.  Doubt tick borne illness-and patient treated with doxycycline anyway.  We explained fever and night sweats are very nonspecific and in addition to infectious etiologies other things like inflammatory arthritis can cause similar symptoms. No red flags such as appetite or weight changes. Consider other possibilities such as CMV or Epstein-Barr virus like illness    Plan:     -Repeat hepatic function to make sure liver transaminases  coming down -Check CMV IgG and IgM antibodies and consider Epstein-Barr virus antibodies -We discussed further testing such HIV antibodies but patient declines he states he has no risk factors. -We discussed other possible testing including for inflammatory arthritis but he declines this time. His arthralgias have resolved -If  symptoms persist repeat tuberculosis screening and obtain inflammatory markers such as rheumatoid factor, ANA, CCP antibodies, sedimentation rate  Eulas Post MD [facility]

## 2017-02-28 NOTE — Patient Instructions (Signed)
Continue to monitor for any fever Let me know in one week if night sweats persist.

## 2017-03-02 ENCOUNTER — Other Ambulatory Visit: Payer: Self-pay | Admitting: Family Medicine

## 2017-03-02 DIAGNOSIS — A6920 Lyme disease, unspecified: Secondary | ICD-10-CM

## 2017-03-02 LAB — EPSTEIN-BARR VIRUS VCA ANTIBODY PANEL
EBV NA IgG: 217 U/mL — ABNORMAL HIGH
EBV VCA IgG: 610 U/mL — ABNORMAL HIGH
EBV VCA IgM: <36 U/mL

## 2017-03-02 LAB — CMV ABS, IGG+IGM (CYTOMEGALOVIRUS)
CMV IGM: 158 [AU]/ml — AB
CYTOMEGALOVIRUS AB-IGG: 3.7 U/mL — AB

## 2017-03-18 ENCOUNTER — Other Ambulatory Visit: Payer: Managed Care, Other (non HMO)

## 2017-03-18 ENCOUNTER — Other Ambulatory Visit (INDEPENDENT_AMBULATORY_CARE_PROVIDER_SITE_OTHER): Payer: Managed Care, Other (non HMO)

## 2017-03-18 DIAGNOSIS — A6920 Lyme disease, unspecified: Secondary | ICD-10-CM

## 2017-03-18 LAB — HEPATIC FUNCTION PANEL
ALBUMIN: 4 g/dL (ref 3.5–5.2)
ALT: 133 U/L — AB (ref 0–53)
AST: 46 U/L — ABNORMAL HIGH (ref 0–37)
Alkaline Phosphatase: 79 U/L (ref 39–117)
BILIRUBIN DIRECT: 0.1 mg/dL (ref 0.0–0.3)
Total Bilirubin: 0.7 mg/dL (ref 0.2–1.2)
Total Protein: 6.8 g/dL (ref 6.0–8.3)

## 2017-03-21 ENCOUNTER — Other Ambulatory Visit: Payer: Self-pay | Admitting: Adult Health

## 2017-03-21 DIAGNOSIS — R945 Abnormal results of liver function studies: Secondary | ICD-10-CM

## 2017-03-21 DIAGNOSIS — R7989 Other specified abnormal findings of blood chemistry: Secondary | ICD-10-CM

## 2017-04-07 ENCOUNTER — Other Ambulatory Visit (INDEPENDENT_AMBULATORY_CARE_PROVIDER_SITE_OTHER): Payer: Managed Care, Other (non HMO)

## 2017-04-07 DIAGNOSIS — R7989 Other specified abnormal findings of blood chemistry: Secondary | ICD-10-CM

## 2017-04-07 DIAGNOSIS — R945 Abnormal results of liver function studies: Secondary | ICD-10-CM | POA: Diagnosis not present

## 2017-04-07 LAB — HEPATIC FUNCTION PANEL
ALK PHOS: 72 U/L (ref 39–117)
ALT: 92 U/L — AB (ref 0–53)
AST: 43 U/L — AB (ref 0–37)
Albumin: 4.1 g/dL (ref 3.5–5.2)
BILIRUBIN DIRECT: 0.1 mg/dL (ref 0.0–0.3)
BILIRUBIN TOTAL: 0.5 mg/dL (ref 0.2–1.2)
Total Protein: 6.7 g/dL (ref 6.0–8.3)

## 2017-04-13 ENCOUNTER — Encounter: Payer: Self-pay | Admitting: Family Medicine

## 2017-09-20 ENCOUNTER — Ambulatory Visit: Payer: Managed Care, Other (non HMO) | Admitting: Family Medicine

## 2018-02-02 MED FILL — OMEPRAZOLE 20 MG CPDR: 20 | 90 days supply | Qty: 90 | Fill #0

## 2018-02-08 IMAGING — DX DG CHEST 2V
2 series · 2 of 2 positions shown · non-contrast
Comparison: None.

CLINICAL DATA: 36-year-old male with fever for 13 days.

EXAM:
CHEST  2 VIEW

[chest pa]
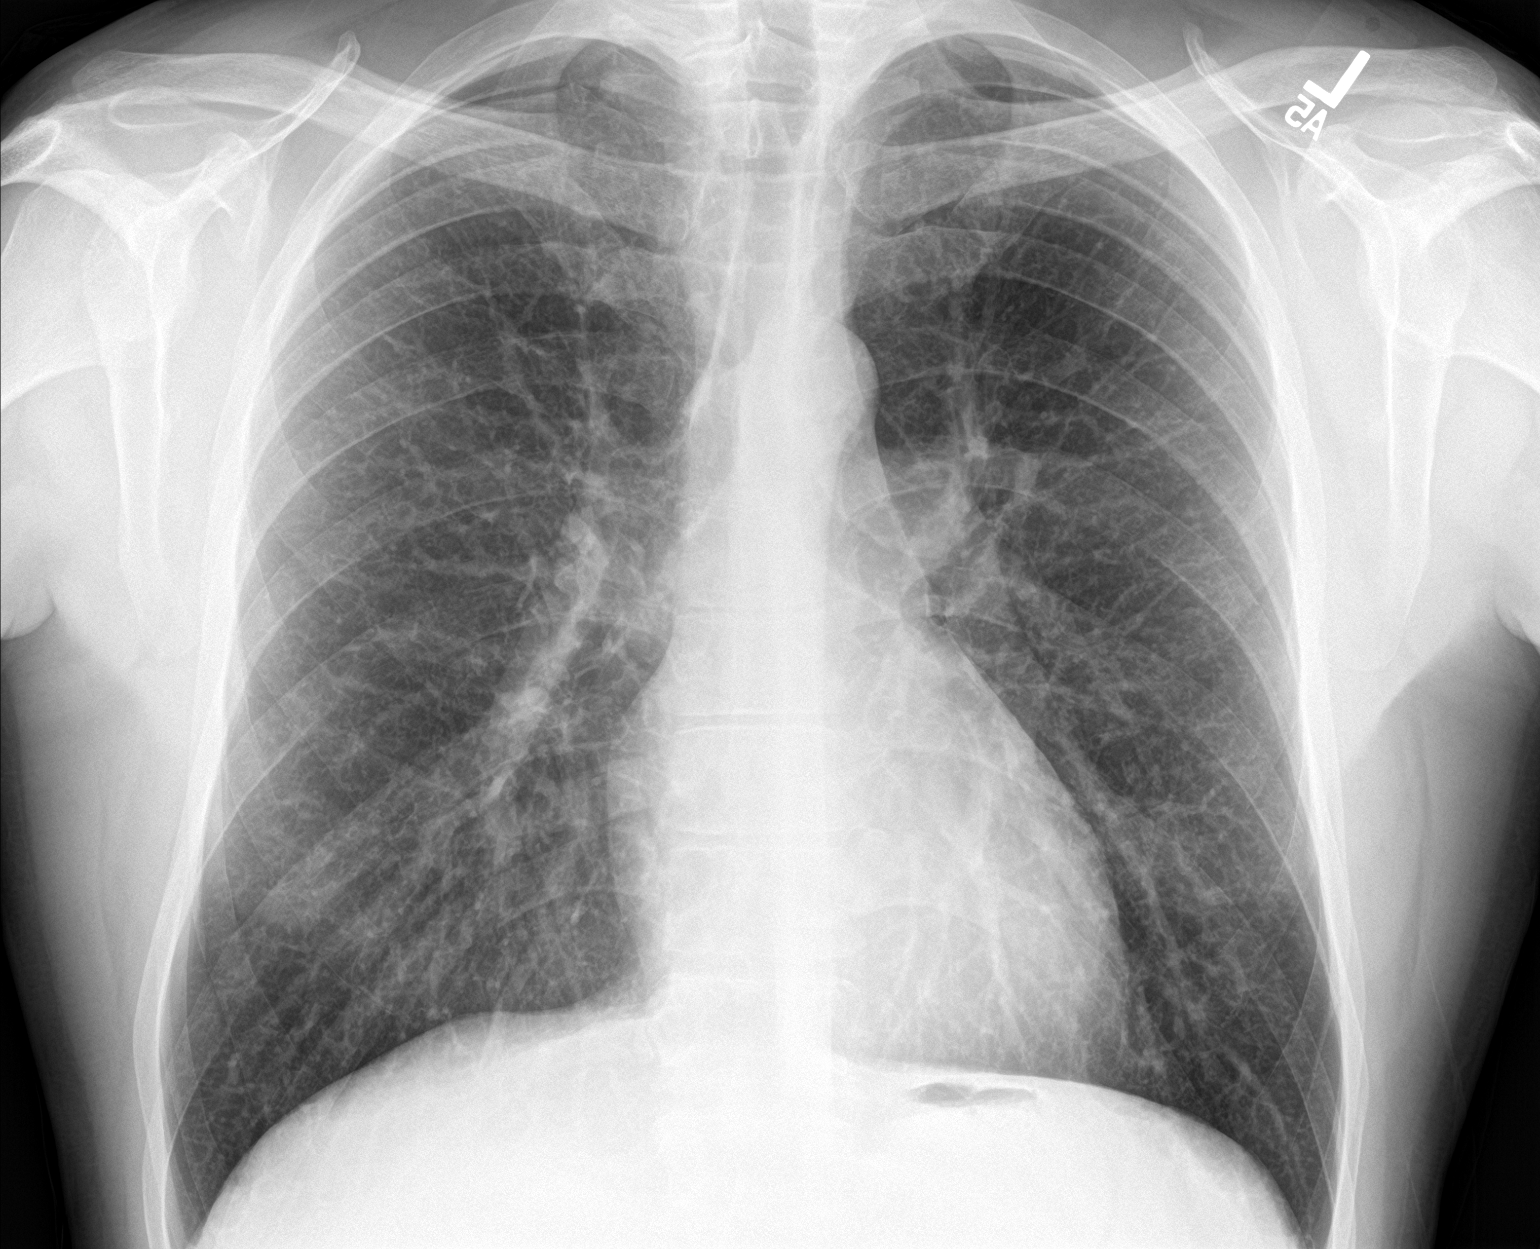

[chest lat]
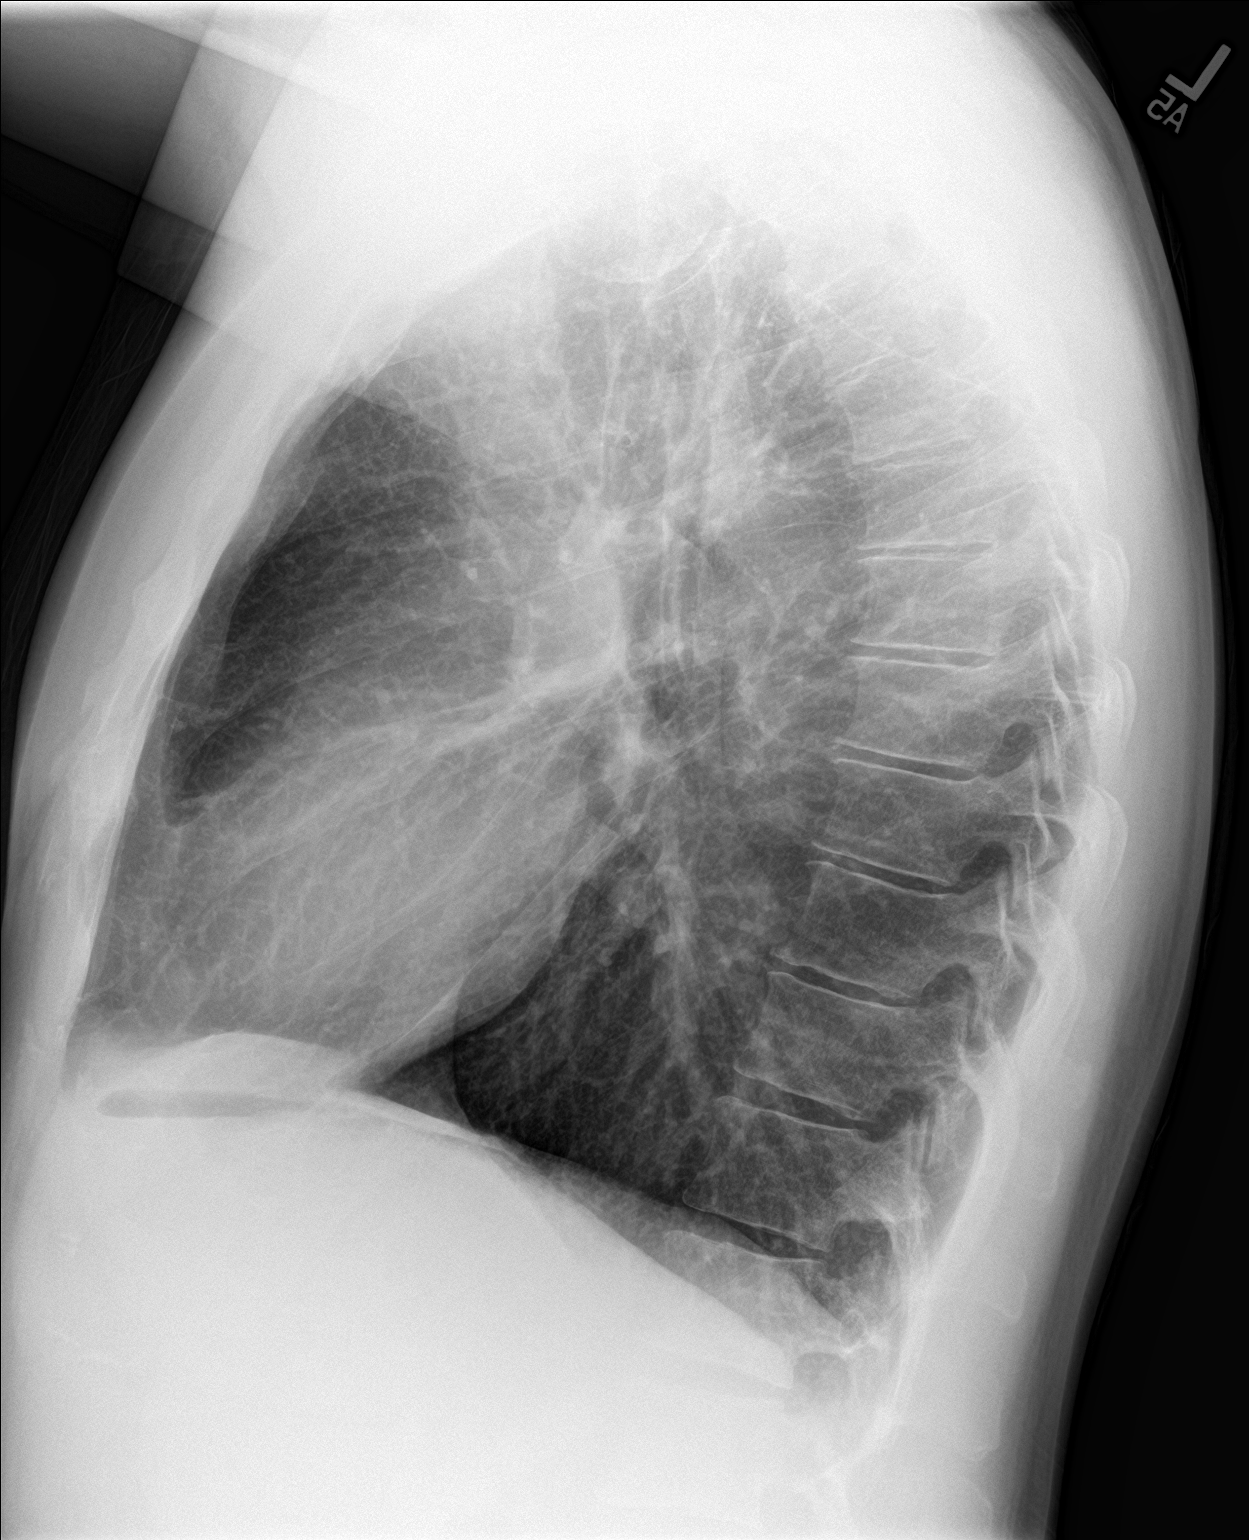

[2 of 2 positions shown; findings below may reference images not displayed]

FINDINGS: The heart size and mediastinal contours are within normal limits.
Both lungs are clear. The visualized skeletal structures are
unremarkable.
IMPRESSION: No active cardiopulmonary disease.

## 2018-11-28 ENCOUNTER — Ambulatory Visit (INDEPENDENT_AMBULATORY_CARE_PROVIDER_SITE_OTHER): Payer: Managed Care, Other (non HMO) | Admitting: Family Medicine

## 2018-11-28 ENCOUNTER — Other Ambulatory Visit: Payer: Self-pay

## 2018-11-28 DIAGNOSIS — J0110 Acute frontal sinusitis, unspecified: Secondary | ICD-10-CM | POA: Diagnosis not present

## 2018-11-28 MED ORDER — AMOXICILLIN-POT CLAVULANATE 875-125 MG PO TABS: 1.0000 | ORAL_TABLET | Freq: Two times a day (BID) | ORAL | 0 refills | Status: AC

## 2018-11-28 NOTE — Progress Notes (Signed)
Virtual Visit via Video Note  I connected with Acelin on 11/28/18 at 12:40 PM EDT by a video enabled telemedicine application and verified that I am speaking with the correct person using two identifiers.  Location patient: home Location provider:work or home office Persons participating in the virtual visit: patient, provider  I discussed the limitations of evaluation and management by telemedicine and the availability of in person appointments. The patient expressed understanding and agreed to proceed.   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 Reports has reported his symptoms to [facility] and they did not recommend testing for [place].  ROS: See pertinent positives and negatives per HPI.  Past Medical History:  Diagnosis Date  . Allergy   . Anxiety   . Chest wall pain   . Depression     Past Surgical History:  Procedure Laterality Date  . cyst removal       Family History  Problem Relation Age of Onset  . Hyperlipidemia Mother   . Hypertension Mother   . Arthritis Mother   . Thyroid cancer Mother   . Hodgkin's lymphoma Mother     SOCIAL HX: see hpi   Current Outpatient Medications:  .  amoxicillin-clavulanate (AUGMENTIN) 875-125 MG tablet, Take 1 tablet by mouth 2 (two) times daily., Disp: 20 tablet, Rfl: 0 .  fluticasone (FLONASE) 50 MCG/ACT nasal spray, Place daily into both nostrils., Disp: , Rfl:   .  omeprazole (PRILOSEC OTC) 20 MG tablet, Take 20 mg by mouth daily., Disp: , Rfl:   EXAM:  VITALS per patient if applicable:  GENERAL: alert, oriented, appears well and in no acute distress  HEENT: atraumatic, conjunttiva clear, no obvious abnormalities on inspection of external nose and ears  NECK: normal movements of the head and neck  LUNGS: on inspection no signs of respiratory distress, breathing rate appears normal, no obvious gross SOB, gasping or wheezing  CV: no obvious cyanosis  MS: moves all visible extremities without noticeable abnormality  PSYCH/NEURO: pleasant and cooperative, no obvious depression or anxiety, speech and thought processing grossly intact  ASSESSMENT AND PLAN:  Discussed the following assessment and plan:  Acute frontal sinusitis, recurrence not specified    -we discussed possible serious and likely etiologies, workup and treatment, treatment risks and return precautions. Possible allergies, vs Viral illness vs developing sinusitis. -after this discussion, Axton opted for nasal saline, restart daily zyzal, delayed abx if worsening or if not improving over next 3-5 days. Advised to continue to notify [facility] of symptoms and discussed COVID19 precautions. Advise wearing mask at all times, good hand hygiene, ensuring no others are around on breaks when mask is off, etc. -of course, we advised Jamieon  to return or notify a doctor immediately if symptoms worsen or persist or new concerns arise.     Lucretia Kern, DO

## 2018-12-27 ENCOUNTER — Other Ambulatory Visit: Payer: Self-pay

## 2018-12-27 DIAGNOSIS — Z20822 Contact with and (suspected) exposure to covid-19: Secondary | ICD-10-CM

## 2018-12-29 LAB — NOVEL CORONAVIRUS, NAA: SARS-CoV-2, NAA: NOT DETECTED

## 2020-03-20 ENCOUNTER — Other Ambulatory Visit (HOSPITAL_COMMUNITY): Payer: Self-pay | Admitting: Cardiology

## 2020-03-20 MED FILL — ASPIRIN ADULT LOW STRENGTH: 81 | 30 days supply | Qty: 30 | Fill #0

## 2020-03-20 MED FILL — PRAVASTATIN SODIUM 20 MG TA: 20 | 30 days supply | Qty: 30 | Fill #0

## 2020-05-01 MED FILL — PRAVASTATIN SODIUM 20 MG TA: 20 | 30 days supply | Qty: 30 | Fill #1

## 2020-08-01 ENCOUNTER — Other Ambulatory Visit (HOSPITAL_COMMUNITY): Payer: Self-pay

## 2020-08-01 MED FILL — Pravastatin Sodium Tab 20 MG: ORAL | 30 days supply | Qty: 30 | Fill #0 | Status: AC

## 2020-08-05 ENCOUNTER — Other Ambulatory Visit (HOSPITAL_COMMUNITY): Payer: Self-pay

## 2021-11-11 ENCOUNTER — Other Ambulatory Visit (HOSPITAL_COMMUNITY): Payer: Self-pay

## 2021-11-11 MED ORDER — BUSPIRONE HCL 10 MG PO TABS
ORAL_TABLET | ORAL | 0 refills | Status: AC
  Filled 2021-11-11: qty 30, 15d supply, fill #0

## 2022-03-24 ENCOUNTER — Institutional Professional Consult (permissible substitution): Payer: Managed Care, Other (non HMO) | Admitting: Pulmonary Disease
